# Patient Record
Sex: Male | Born: 1945 | Race: White | Hispanic: No | Marital: Married | Smoking: Former smoker
Health system: Southern US, Community
[De-identification: ages and names within clinical notes are randomized; demographics above are authoritative.]

## PROBLEM LIST (undated history)

## (undated) DIAGNOSIS — I803 Phlebitis and thrombophlebitis of lower extremities, unspecified: Secondary | ICD-10-CM

## (undated) DIAGNOSIS — G902 Horner's syndrome: Secondary | ICD-10-CM

## (undated) DIAGNOSIS — E785 Hyperlipidemia, unspecified: Secondary | ICD-10-CM

## (undated) DIAGNOSIS — N486 Induration penis plastica: Secondary | ICD-10-CM

## (undated) DIAGNOSIS — K4090 Unilateral inguinal hernia, without obstruction or gangrene, not specified as recurrent: Secondary | ICD-10-CM

## (undated) DIAGNOSIS — C801 Malignant (primary) neoplasm, unspecified: Secondary | ICD-10-CM

## (undated) DIAGNOSIS — G4700 Insomnia, unspecified: Secondary | ICD-10-CM

## (undated) DIAGNOSIS — I8390 Asymptomatic varicose veins of unspecified lower extremity: Secondary | ICD-10-CM

## (undated) HISTORY — PX: TOOTH EXTRACTION: SUR596

## (undated) HISTORY — DX: Unilateral inguinal hernia, without obstruction or gangrene, not specified as recurrent: K40.90

## (undated) HISTORY — DX: Induration penis plastica: N48.6

## (undated) HISTORY — DX: Hyperlipidemia, unspecified: E78.5

## (undated) HISTORY — DX: Horner's syndrome: G90.2

## (undated) HISTORY — PX: VASECTOMY: SHX75

## (undated) HISTORY — DX: Asymptomatic varicose veins of unspecified lower extremity: I83.90

## (undated) HISTORY — DX: Phlebitis and thrombophlebitis of lower extremities, unspecified: I80.3

## (undated) HISTORY — DX: Insomnia, unspecified: G47.00

## (undated) HISTORY — DX: Malignant (primary) neoplasm, unspecified: C80.1

## (undated) HISTORY — PX: EYE SURGERY: SHX253

---
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
## 1999-04-20 ENCOUNTER — Ambulatory Visit (HOSPITAL_COMMUNITY): Admission: RE | Admit: 1999-04-20 | Discharge: 1999-04-22 | Payer: Self-pay | Admitting: Ophthalmology

## 2000-08-22 ENCOUNTER — Ambulatory Visit (HOSPITAL_COMMUNITY): Admission: RE | Admit: 2000-08-22 | Discharge: 2000-08-22 | Payer: Self-pay | Admitting: Ophthalmology

## 2000-08-22 ENCOUNTER — Encounter: Payer: Self-pay | Admitting: Ophthalmology

## 2000-11-22 ENCOUNTER — Encounter: Payer: Self-pay | Admitting: Family Medicine

## 2000-11-22 ENCOUNTER — Ambulatory Visit (HOSPITAL_COMMUNITY): Admission: RE | Admit: 2000-11-22 | Discharge: 2000-11-22 | Payer: Self-pay | Admitting: Family Medicine

## 2002-05-25 ENCOUNTER — Encounter: Payer: Self-pay | Admitting: Family Medicine

## 2002-05-25 ENCOUNTER — Ambulatory Visit (HOSPITAL_COMMUNITY): Admission: RE | Admit: 2002-05-25 | Discharge: 2002-05-25 | Payer: Self-pay | Admitting: Family Medicine

## 2004-08-17 ENCOUNTER — Ambulatory Visit (HOSPITAL_COMMUNITY): Admission: RE | Admit: 2004-08-17 | Discharge: 2004-08-17 | Payer: Self-pay | Admitting: Ophthalmology

## 2004-08-17 IMAGING — CR DG CHEST 2V
2 series · 2 of 2 positions shown · non-contrast
Comparison: none

HISTORY: Preoperative evaluation

CHEST 2 VIEWS:
No prior studies available for comparison
Normal heart size, mediastinal contours, and vascularity.
Lungs clear.
No effusion or pneumothorax.
Minimal spur formation thoracic spine.

[view not recorded (1 of 2)]
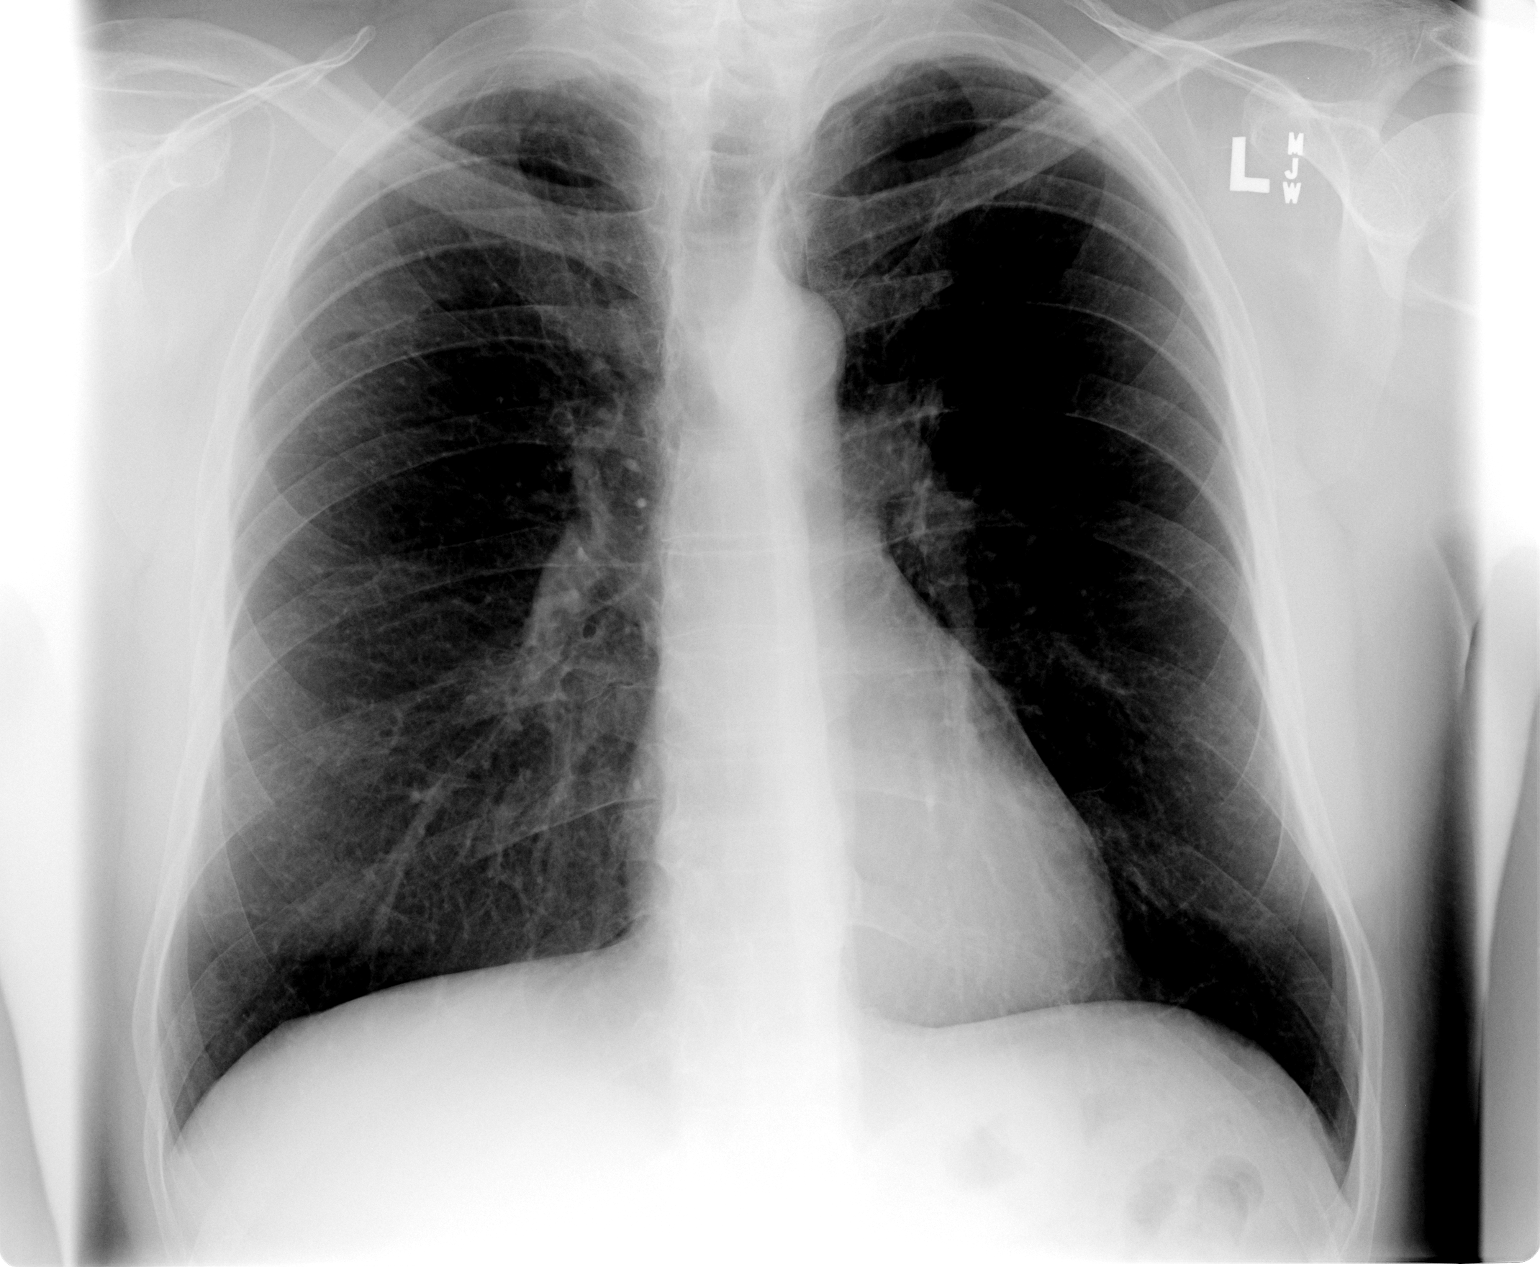

[view not recorded (2 of 2)]
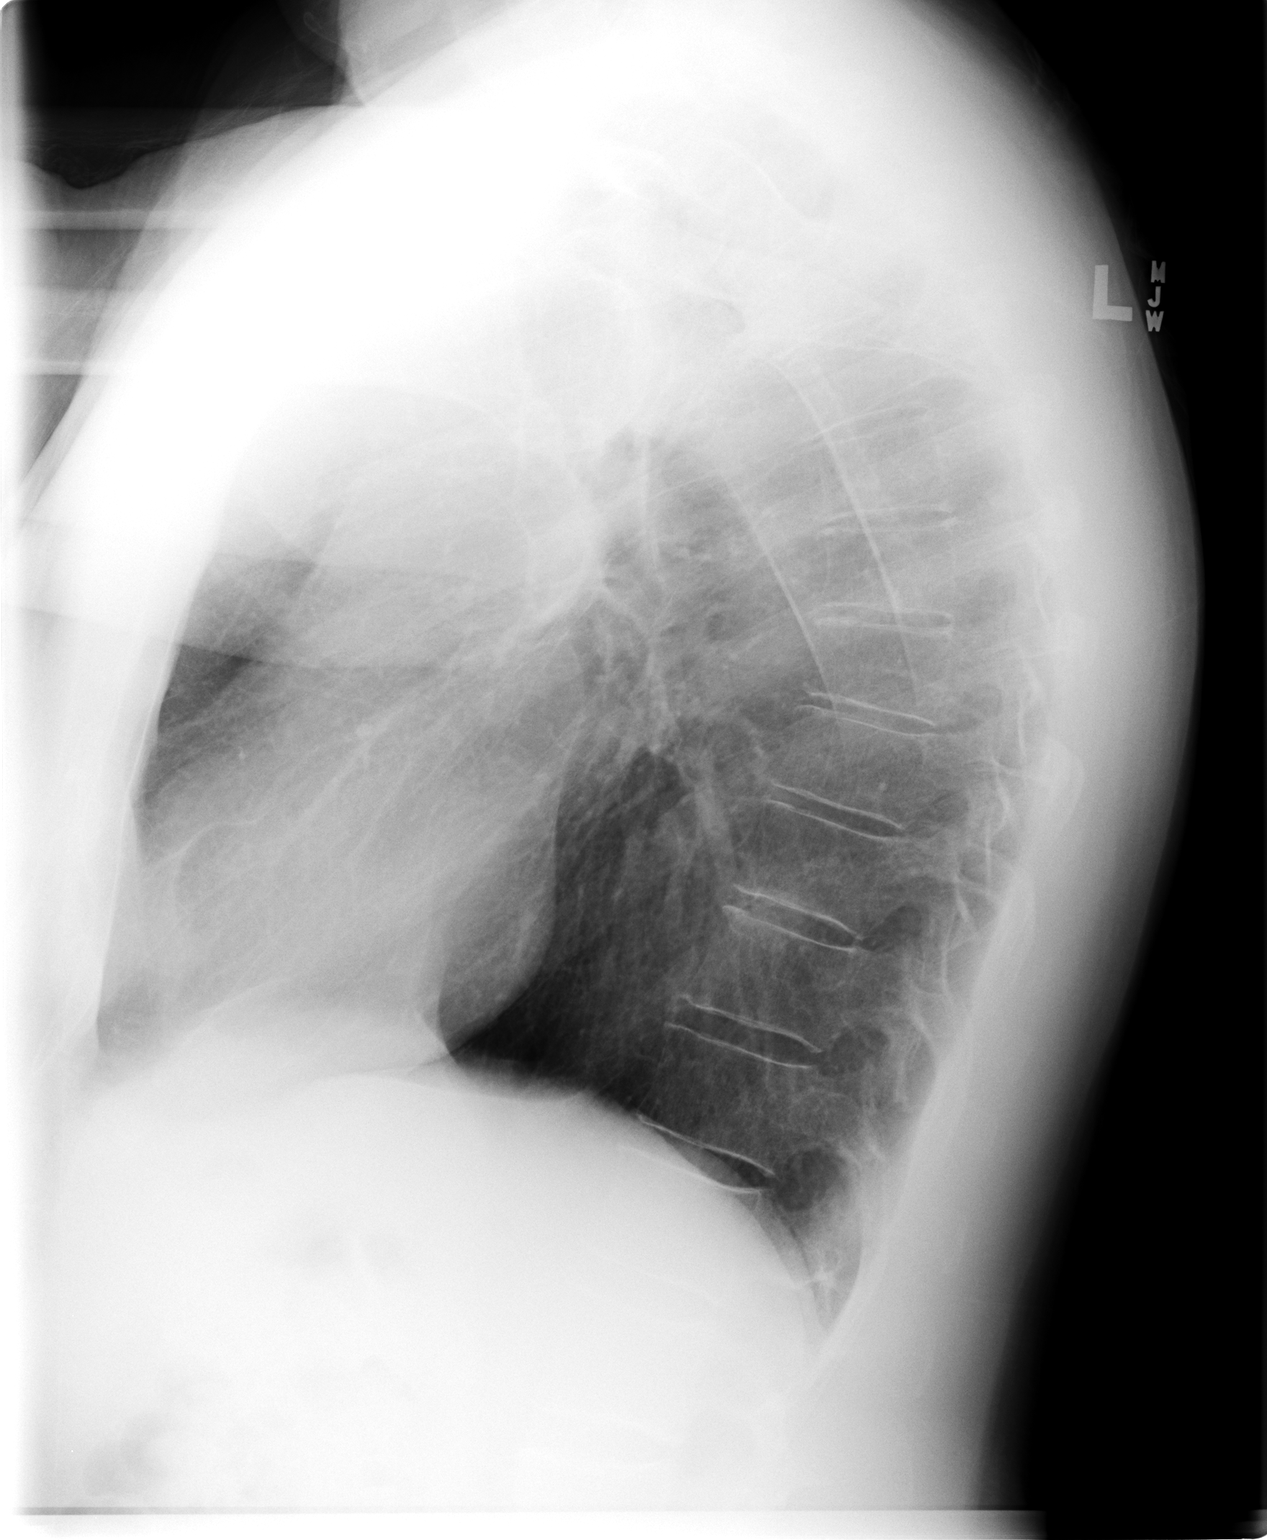

[2 of 2 positions shown; findings below may reference images not displayed]

IMPRESSION: No acute abnormalities.

## 2004-11-23 ENCOUNTER — Ambulatory Visit (HOSPITAL_COMMUNITY): Admission: RE | Admit: 2004-11-23 | Discharge: 2004-11-23 | Payer: Self-pay | Admitting: Gastroenterology

## 2005-04-02 ENCOUNTER — Ambulatory Visit: Admission: RE | Admit: 2005-04-02 | Discharge: 2005-04-02 | Payer: Self-pay | Admitting: Family Medicine

## 2006-03-06 HISTORY — PX: TOE SURGERY: SHX1073

## 2009-05-09 DIAGNOSIS — N486 Induration penis plastica: Secondary | ICD-10-CM

## 2009-05-09 HISTORY — DX: Induration penis plastica: N48.6

## 2010-05-25 DIAGNOSIS — E785 Hyperlipidemia, unspecified: Secondary | ICD-10-CM

## 2010-05-25 HISTORY — DX: Hyperlipidemia, unspecified: E78.5

## 2010-09-06 DIAGNOSIS — C801 Malignant (primary) neoplasm, unspecified: Secondary | ICD-10-CM

## 2010-09-06 DIAGNOSIS — I803 Phlebitis and thrombophlebitis of lower extremities, unspecified: Secondary | ICD-10-CM

## 2010-09-06 HISTORY — DX: Phlebitis and thrombophlebitis of lower extremities, unspecified: I80.3

## 2010-09-06 HISTORY — DX: Malignant (primary) neoplasm, unspecified: C80.1

## 2011-01-04 ENCOUNTER — Other Ambulatory Visit: Payer: Self-pay | Admitting: Family Medicine

## 2011-01-04 ENCOUNTER — Ambulatory Visit
Admission: RE | Admit: 2011-01-04 | Discharge: 2011-01-04 | Disposition: A | Discharge status: Home / Self Care | Payer: BC Managed Care – PPO | Source: Ambulatory Visit | Attending: Family Medicine | Admitting: Family Medicine

## 2011-01-04 DIAGNOSIS — R52 Pain, unspecified: Secondary | ICD-10-CM

## 2011-01-04 DIAGNOSIS — R609 Edema, unspecified: Secondary | ICD-10-CM

## 2011-02-16 ENCOUNTER — Other Ambulatory Visit: Payer: Self-pay | Admitting: Dermatology

## 2011-08-17 ENCOUNTER — Ambulatory Visit (INDEPENDENT_AMBULATORY_CARE_PROVIDER_SITE_OTHER): Payer: Self-pay | Admitting: General Surgery

## 2011-08-20 ENCOUNTER — Ambulatory Visit (INDEPENDENT_AMBULATORY_CARE_PROVIDER_SITE_OTHER): Payer: BC Managed Care – PPO | Admitting: General Surgery

## 2011-08-20 ENCOUNTER — Encounter (INDEPENDENT_AMBULATORY_CARE_PROVIDER_SITE_OTHER): Payer: Self-pay | Admitting: General Surgery

## 2011-08-20 VITALS — BP 124/82 | HR 70 | Temp 97.6°F | Resp 16 | Ht 72.0 in | Wt 194.8 lb

## 2011-08-20 DIAGNOSIS — K4090 Unilateral inguinal hernia, without obstruction or gangrene, not specified as recurrent: Secondary | ICD-10-CM

## 2011-08-20 NOTE — Progress Notes (Signed)
Patient ID: Vernon Kent, male   DOB: Apr 03, 1946, 65 y.o.   MRN: [id number]  Chief Complaint  Patient presents with  . Other    Evaluate RIH    HPI Vernon Kent is a 65 y.o. male.  This patient is referred by Dr. Kevan Ny for evaluation of a right inguinal hernia. He states that he was diagnosed with a weakness in the right groin approximately 10 years ago by his primary care physician and saw a surgeon here for evaluation but they decided not to perform surgery at that time. He has really been bothered by this until recently he's had some achiness in the area and hears an audible "squishy noise" in the area. He does feel something bulging in the area although he does not notice a visible bulge. He states that his bowels are normal and he denies any nausea vomiting or obstructive symptoms. HPI  Past Medical History  Diagnosis Date  . Hyperlipidemia   . Phlebitis of leg, left 2012    Past Surgical History  Procedure Date  . Eye surgery     No family history on file.  Social History History  Substance Use Topics  . Smoking status: Former Games developer  . Smokeless tobacco: Former Neurosurgeon    Quit date: 08/19/1966  . Alcohol Use: 0.5 - 1.0 oz/week    1-2 drink(s) per week    Allergies no known allergies  Current Outpatient Prescriptions  Medication Sig Dispense Refill  . clobetasol cream (TEMOVATE) 0.05 %       . olopatadine (PATANOL) 0.1 % ophthalmic solution 1 drop 2 (two) times daily.        . simvastatin (ZOCOR) 20 MG tablet         Review of Systems Review of Systems All other review of systems negative or noncontributory except as stated in the HPI  Blood pressure 124/82, pulse 70, temperature 97.6 F (36.4 C), temperature source Temporal, resp. rate 16, height 6' (1.829 m), weight 194 lb 12.8 oz (88.361 kg).  Physical Exam Physical Exam  Vitals reviewed. Constitutional: He is oriented to person, place, and time. He appears well-developed and well-nourished. No  distress.  HENT:  Head: Normocephalic and atraumatic.  Mouth/Throat: No oropharyngeal exudate.  Eyes: Conjunctivae are normal. Pupils are equal, round, and reactive to light. Right eye exhibits no discharge. Left eye exhibits no discharge. No scleral icterus.       Spot on right Iris  Neck: Normal range of motion. No tracheal deviation present.  Cardiovascular: Normal rate, regular rhythm and normal heart sounds.   Pulmonary/Chest: Effort normal and breath sounds normal. No stridor. No respiratory distress. He has no wheezes.  Abdominal: Soft. He exhibits no distension. There is no tenderness. There is no rebound and no guarding.       He does appear to have a reducible inguinal hernia on the right and has a visible bulge with standing.  no evidence of left inguinal hernia. I also feel a small reducible umbilical hernia.  Musculoskeletal: Normal range of motion. He exhibits no edema and no tenderness.  Neurological: He is alert and oriented to person, place, and time.  Skin: Skin is warm and dry. No rash noted. He is not diaphoretic. No erythema. No pallor.  Psychiatric: He has a normal mood and affect. His behavior is normal. Judgment and thought content normal.    Data Reviewed   Assessment    Right inguinal hernia and umbilical hernia He has no evidence of incarceration  or strangulation and is relatively asymptomatic although he does have some mild symptoms. We discussed the options of watchful waiting versus open or laparoscopic repair and the risks and benefits of each. He is leaning towards surgical repair but will discuss this with his wife prior to scheduling. I think he would be a good candidate for laparoscopic repair given the fact he has an umbilical hernia as well and we could fix this simultaneously. We discussed the risks of infection bleeding pain, scarring, recurrence, injury to vas deferens and loss of testicle, and chronic pain and a specimen understanding and desires  surgical repair.    Plan    He will discuss this with his wife and call us back to schedule surgical repair.       Lodema Pilot DAVID 08/20/2011, 11:55 AM

## 2012-03-07 ENCOUNTER — Other Ambulatory Visit: Payer: Self-pay

## 2012-03-07 DIAGNOSIS — I83893 Varicose veins of bilateral lower extremities with other complications: Secondary | ICD-10-CM

## 2012-03-31 ENCOUNTER — Encounter: Payer: Self-pay | Admitting: Vascular Surgery

## 2012-04-11 ENCOUNTER — Encounter: Payer: Self-pay | Admitting: Vascular Surgery

## 2012-04-11 ENCOUNTER — Encounter (INDEPENDENT_AMBULATORY_CARE_PROVIDER_SITE_OTHER): Payer: Self-pay | Admitting: Surgery

## 2012-04-11 ENCOUNTER — Ambulatory Visit (INDEPENDENT_AMBULATORY_CARE_PROVIDER_SITE_OTHER): Payer: BC Managed Care – PPO | Admitting: Surgery

## 2012-04-11 VITALS — BP 118/72 | HR 74 | Temp 97.8°F | Resp 16 | Ht 72.0 in | Wt 186.1 lb

## 2012-04-11 DIAGNOSIS — K4090 Unilateral inguinal hernia, without obstruction or gangrene, not specified as recurrent: Secondary | ICD-10-CM | POA: Insufficient documentation

## 2012-04-11 NOTE — Progress Notes (Signed)
Patient ID: Vernon Kent, male   DOB: 01-14-1946, 66 y.o.   MRN: [id number]  CC - right inguinal hernia  HPI Vernon Kent is a 66 y.o. male.   HPI This is a 66 yo patient in good health who presents with at least a 14-year history of a right inguinal hernia.  Recently, it has become more symptomatic and the bulge seems more prominent.  The patient occasionally hears some "gurgling" in the hernia.  He is quite active and exercises regularly, but the hernia is causing more discomfort.  It remains reducible.  He presents now to discuss surgical repair. Past Medical History  Diagnosis Date  . Phlebitis of leg, left 2012  . Asymptomatic varicose veins   . Varicose veins   . Insomnia   . 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 Dione Booze  )  . Diverticulosis   . Cancer 2012    BCC- Basal Cell Carcinoma Forehead  . Peyronie disease 05/09/2009  . Hyperlipidemia 05/25/2010    Past Surgical History  Procedure Date  . Eye surgery   . Hernia repair 05/25/2010    Right inguinal   . Toe surgery July 2007    Left 2nd toe  . Tonsillectomy 1955  . Vasectomy   . Tooth extraction     Wisdom teeth    Family History  Problem Relation Age of Onset  . Pneumonia Mother   . Skin cancer Mother   . Heart disease Father   . Hypertension Father   . Hepatitis Father   . Cancer Brother     Prostate  . Heart attack Brother     Social History History  Substance Use Topics  . Smoking status: Former Smoker    Types: Cigarettes    Quit date: 09/06/1965  . Smokeless tobacco: Former Neurosurgeon    Quit date: 08/19/1966  . Alcohol Use: 0.0 oz/week    4-5 Glasses of wine, 1-2 Drinks containing 0.5 oz of alcohol per week    Allergies  Allergen Reactions  . Shellfish Allergy Anaphylaxis  . Iodine     Current Outpatient Prescriptions  Medication Sig Dispense Refill  . aspirin 81 MG tablet Take 81 mg by mouth daily.      . betamethasone dipropionate (DIPROLENE) 0.05 %  cream Apply topically daily.      . calcium carbonate 200 MG capsule Take 250 mg by mouth 2 (two) times daily with a meal.      . cholecalciferol (VITAMIN D) 400 UNITS TABS Take 400 Units by mouth.      . clobetasol cream (TEMOVATE) 0.05 %       . diphenhydrAMINE (BENADRYL) 25 mg capsule Take 25 mg by mouth as needed.      Marland Kitchen EPINEPHrine (EPI-PEN) 0.3 mg/0.3 mL DEVI Inject 0.3 mg into the muscle once.      . fish oil-omega-3 fatty acids 1000 MG capsule Take 2 g by mouth daily.      Marland Kitchen glucosamine-chondroitin 500-400 MG tablet Take 1,500 tablets by mouth daily.      Marland Kitchen ibuprofen (ADVIL,MOTRIN) 200 MG tablet Take 200 mg by mouth every 6 (six) hours as needed.      . Multiple Vitamin (MULTIVITAMIN) tablet Take 1 tablet by mouth daily.      Marland Kitchen olopatadine (PATANOL) 0.1 % ophthalmic solution 1 drop 2 (two) times daily.        . simvastatin (ZOCOR) 20 MG tablet       .  zolpidem (AMBIEN) 10 MG tablet Take 10 mg by mouth at bedtime as needed.        Review of Systems Review of Systems  Constitutional: Negative for fever, chills and unexpected weight change.  HENT: Negative for hearing loss, congestion, sore throat, trouble swallowing and voice change.   Eyes: Negative for visual disturbance.  Respiratory: Negative for cough and wheezing.   Cardiovascular: Negative for chest pain, palpitations and leg swelling.  Gastrointestinal: Positive for abdominal pain. Negative for nausea, vomiting, diarrhea, constipation, blood in stool, abdominal distention, anal bleeding and rectal pain.  Genitourinary: Negative for hematuria and difficulty urinating.  Musculoskeletal: Negative for arthralgias.  Skin: Negative for rash and wound.  Neurological: Negative for seizures, syncope, weakness and headaches.  Hematological: Negative for adenopathy. Does not bruise/bleed easily.  Psychiatric/Behavioral: Negative for confusion.    Blood pressure 118/72, pulse 74, temperature 97.8 F (36.6 C), temperature source  Temporal, resp. rate 16, height 6' (1.829 m), weight 186 lb 2 oz (84.426 kg).  Physical Exam Physical Exam WDWN in NAD HEENT:  EOMI, sclera anicteric Neck:  No masses, no thyromegaly Lungs:  CTA bilaterally; normal respiratory effort CV:  Regular rate and rhythm; no murmurs Abd:  +bowel sounds, soft, non-tender, very tiny asymptomatic umbilical hernia GU:  Bilateral descended testes; visible right inguinal bulge - reducible; no sign of left inguinal hernia Ext:  Well-perfused; no edema Skin:  Warm, dry; no sign of jaundice  Data Reviewed none  Assessment    Right inguinal hernia - large reducible    Plan    Right inguinal hernia repair with mesh.  The surgical procedure has been discussed with the patient.  Potential risks, benefits, alternative treatments, and expected outcomes have been explained.  All of the patient's questions at this time have been answered.  The likelihood of reaching the patient's treatment goal is good.  The patient understand the proposed surgical procedure and wishes to proceed.        Soren Lazarz K. 04/11/2012, 7:53 PM

## 2012-04-12 ENCOUNTER — Encounter (INDEPENDENT_AMBULATORY_CARE_PROVIDER_SITE_OTHER): Payer: BC Managed Care – PPO | Admitting: *Deleted

## 2012-04-12 ENCOUNTER — Ambulatory Visit (INDEPENDENT_AMBULATORY_CARE_PROVIDER_SITE_OTHER): Payer: BC Managed Care – PPO | Admitting: Vascular Surgery

## 2012-04-12 ENCOUNTER — Encounter: Payer: Self-pay | Admitting: Vascular Surgery

## 2012-04-12 VITALS — BP 120/74 | HR 54 | Resp 18 | Ht 72.0 in | Wt 187.1 lb

## 2012-04-12 DIAGNOSIS — I83893 Varicose veins of bilateral lower extremities with other complications: Secondary | ICD-10-CM

## 2012-04-12 NOTE — Assessment & Plan Note (Signed)
This patient has significant varicose veins of the left lower extremity with incompetence of the greater saphenous vein on the left and also the lesser saphenous vein. I have discussed the importance of intermittent leg elevation and the proper positioning for this. We have also discussed the importance of using compression stockings. He has been using a knee-high stockings however it sounds like these are stretched out. I have written him a prescription for a thigh high compression stocking with a gradient of 20-30 mm of mercury. We discussed the importance of trying to avoid prolonged sitting and standing. I'll arrange for him to be seen back in 3 months by Dr. Hart Rochester or Dr. Arbie Cookey. If his symptoms are not improved he might be considered for laser ablation of the left greater saphenous vein and left lesser saphenous vein. He knows to call sooner if he has problems.

## 2012-04-12 NOTE — Progress Notes (Signed)
[facility]  Patient name: Vernon Kent MRN: [id number] DOB: [birth year] Sex: male  REASON FOR CONSULT: painful varicose veins of the left lower extremity. Referred by Dr. Shaune Pollack  HPI: Vernon Kent is a 66 y.o. male who is had a long history of varicose veins of his left lower extremity. He was on a trip approximately 18 months ago [place] and developed significant symptoms in his left calf. He had a venous duplex which did not show any evidence of DVT but it sounds like he had an episode of phlebitis. Since that time he continued to have aching pain in his left leg which is worsened by prolonged standing. His symptoms are relieved somewhat with elevation. He describes as heaviness in his leg with prolonged standing. He's had no bleeding episodes related to his varicose veins.  Past Medical History  Diagnosis Date  . Phlebitis of leg, left 2012  . Asymptomatic varicose veins   . Varicose veins   . Insomnia   . 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 Dione Booze  )  . Diverticulosis   . Peyronie disease 05/09/2009  . Hyperlipidemia 05/25/2010  . Cancer 2012    BCC- Basal Cell Carcinoma Forehead    Family History  Problem Relation Age of Onset  . Pneumonia Mother   . Skin cancer Mother   . Heart disease Father   . Hypertension Father   . Hepatitis Father   . Cancer Brother     Prostate  . Heart attack Brother     SOCIAL HISTORY: History  Substance Use Topics  . Smoking status: Former Smoker    Types: Cigarettes    Quit date: 09/06/1965  . Smokeless tobacco: Former Neurosurgeon    Quit date: 08/19/1966  . Alcohol Use: 0.0 oz/week    4-5 Glasses of wine, 1-2 Drinks containing 0.5 oz of alcohol per week    Allergies  Allergen Reactions  . Shellfish Allergy Anaphylaxis  . Iodine     Current Outpatient Prescriptions  Medication Sig Dispense Refill  . aspirin 81 MG tablet Take 81 mg by mouth daily.      .  betamethasone dipropionate (DIPROLENE) 0.05 % cream Apply topically as needed.       . calcium carbonate 200 MG capsule Take 250 mg by mouth 2 (two) times daily with a meal.      . cholecalciferol (VITAMIN D) 400 UNITS TABS Take 400 Units by mouth.      . clobetasol cream (TEMOVATE) 0.05 % as needed.       . diphenhydrAMINE (BENADRYL) 25 mg capsule Take 25 mg by mouth as needed.      Marland Kitchen EPINEPHrine (EPI-PEN) 0.3 mg/0.3 mL DEVI Inject 0.3 mg into the muscle once.      . fish oil-omega-3 fatty acids 1000 MG capsule Take 2 g by mouth daily.      Marland Kitchen glucosamine-chondroitin 500-400 MG tablet Take 1,500 tablets by mouth daily.      Marland Kitchen ibuprofen (ADVIL,MOTRIN) 200 MG tablet Take 200 mg by mouth every 6 (six) hours as needed.      . Multiple Vitamin (MULTIVITAMIN) tablet Take 1 tablet by mouth daily.      Marland Kitchen olopatadine (PATANOL) 0.1 % ophthalmic solution Place 1 drop into both eyes as needed.       . simvastatin (ZOCOR) 20 MG tablet daily.       Marland Kitchen zolpidem (AMBIEN) 10 MG tablet  Take 10 mg by mouth at bedtime as needed.        REVIEW OF SYSTEMS: Arly.Keller ] denotes positive finding; [  ] denotes negative finding  CARDIOVASCULAR:  [ ]  chest pain   [ ]  chest pressure   [ ]  palpitations   [ ]  orthopnea   [ ]  dyspnea on exertion   [ ]  claudication   [ ]  rest pain   [ ]  DVT   Arly.Keller ] phlebitis-left calf PULMONARY:   [ ]  productive cough   [ ]  asthma   [ ]  wheezing NEUROLOGIC:   [ ]  weakness  [ ]  paresthesias  [ ]  aphasia  [ ]  amaurosis  [ ]  dizziness HEMATOLOGIC:   [ ]  bleeding problems   [ ]  clotting disorders MUSCULOSKELETAL:  [ ]  joint pain   [ ]  joint swelling Arly.Keller ] leg swelling left leg GASTROINTESTINAL: [ ]   blood in stool  [ ]   hematemesis GENITOURINARY:  [ ]   dysuria  [ ]   hematuria PSYCHIATRIC:  [ ]  history of major depression INTEGUMENTARY:  [ ]  rashes  [ ]  ulcers CONSTITUTIONAL:  [ ]  fever   [ ]  chills  PHYSICAL EXAM: Filed Vitals:   04/12/12 1352  BP: 120/74  Pulse: 54  Resp: 18  Height: 6' (1.829  m)  Weight: 187 lb 1.6 oz (84.868 kg)   Body mass index is 25.38 kg/(m^2). GENERAL: The patient is a well-nourished male, in no acute distress. The vital signs are documented above. CARDIOVASCULAR: There is a regular rate and rhythm without significant murmur appreciated. I do not detect carotid bruits. He has palpable femoral, popliteal, and pedal pulses. He has mild left lower extremity swelling. PULMONARY: There is good air exchange bilaterally without wheezing or rales. ABDOMEN: Soft and non-tender with normal pitched bowel sounds.  MUSCULOSKELETAL: There are no major deformities or cyanosis. NEUROLOGIC: No focal weakness or paresthesias are detected. SKIN: he has some large truncal varicosities along the medial aspect of his left calf. PSYCHIATRIC: The patient has a normal affect.  DATA:  I have independently interpreted his venous duplex scan which shows incompetence of the lesser saphenous vein on the left and also the midportion of the greater saphenous vein in the left leg. There is also some incompetence of the deep veins. There is no evidence of DVT.  MEDICAL ISSUES:  Varicose veins of lower extremities with other complications This patient has significant varicose veins of the left lower extremity with incompetence of the greater saphenous vein on the left and also the lesser saphenous vein. I have discussed the importance of intermittent leg elevation and the proper positioning for this. We have also discussed the importance of using compression stockings. He has been using a knee-high stockings however it sounds like these are stretched out. I have written him a prescription for a thigh high compression stocking with a gradient of 20-30 mm of mercury. We discussed the importance of trying to avoid prolonged sitting and standing. I'll arrange for him to be seen back in 3 months by Dr. Hart Rochester or Dr. Arbie Cookey. If his symptoms are not improved he might be considered for laser ablation of  the left greater saphenous vein and left lesser saphenous vein. He knows to call sooner if he has problems.   Alexzia Kasler S [facility] [place] Beeper: [phone]

## 2012-07-13 ENCOUNTER — Other Ambulatory Visit (INDEPENDENT_AMBULATORY_CARE_PROVIDER_SITE_OTHER): Payer: Self-pay | Admitting: Surgery

## 2012-07-13 NOTE — Progress Notes (Signed)
Patient is scheduled for surgery on 07/28/12  Preop on 07/25/12.  Need orders in EPIC.  Thank You.

## 2012-07-24 ENCOUNTER — Encounter (HOSPITAL_COMMUNITY): Payer: Self-pay | Admitting: Pharmacist

## 2012-07-24 ENCOUNTER — Encounter: Payer: Self-pay | Admitting: Vascular Surgery

## 2012-07-24 NOTE — Patient Instructions (Addendum)
20 REUVEN BRAVER  07/24/2012   Your procedure is scheduled on:  07-28-2012  Report to [facility] at 0530  AM.  Call this number if you have problems the morning of surgery: [phone]   Remember:  Driver for day of surgery and someone to stay with you for 24 hours after surgery. Wife lynne [phone] cell   Do not eat food or drink liquids:After Midnight.  .  Take these medicines the morning of surgery with A SIP OF WATER: pataday eye drop if needed.   Do not wear jewelry or make up.  Do not wear lotions, powders, or perfumes. You may wear deodorant.    Do not bring valuables to the hospital.  Contacts, dentures or bridgework may not be worn into surgery.  Leave suitcase in the car. After surgery it may be brought to your room.  For patients admitted to the hospital, checkout time is 11:00 AM the day of discharge                             Patients discharged the day of surgery will not be allowed to drive home. If going home same day of surgery, you must have someone stay with you the first 24 hours at home and arrange for some one to drive you home from hospital.    Special Instructions: See [facility] Preparing for Surgery instruction sheet. Women do not shave legs or underarms for 12 hours before showers. Men may shave face morning of surgery.    Please read over the following fact sheets that you were given: MRSA Information  Cain S[facility] pre op nurse phone number [phone], call if needed

## 2012-07-25 ENCOUNTER — Encounter (HOSPITAL_COMMUNITY)
Admission: RE | Admit: 2012-07-25 | Discharge: 2012-07-25 | Disposition: A | Discharge status: Home / Self Care | Payer: BC Managed Care – PPO | Source: Ambulatory Visit | Attending: Surgery | Admitting: Surgery

## 2012-07-25 ENCOUNTER — Encounter: Payer: Self-pay | Admitting: Vascular Surgery

## 2012-07-25 ENCOUNTER — Ambulatory Visit (INDEPENDENT_AMBULATORY_CARE_PROVIDER_SITE_OTHER): Payer: BC Managed Care – PPO | Admitting: Vascular Surgery

## 2012-07-25 ENCOUNTER — Other Ambulatory Visit (INDEPENDENT_AMBULATORY_CARE_PROVIDER_SITE_OTHER): Payer: Self-pay | Admitting: Surgery

## 2012-07-25 ENCOUNTER — Encounter (HOSPITAL_COMMUNITY): Payer: Self-pay

## 2012-07-25 VITALS — BP 140/114 | HR 107 | Resp 16 | Ht 72.0 in | Wt 191.5 lb

## 2012-07-25 DIAGNOSIS — I8390 Asymptomatic varicose veins of unspecified lower extremity: Secondary | ICD-10-CM | POA: Insufficient documentation

## 2012-07-25 LAB — CBC
Platelets: 277 10*3/uL (ref 150–400)
RBC: 4.84 MIL/uL (ref 4.22–5.81)
WBC: 7.2 10*3/uL (ref 4.0–10.5)

## 2012-07-25 LAB — BASIC METABOLIC PANEL
CO2: 26 mEq/L (ref 19–32)
Glucose, Bld: 91 mg/dL (ref 70–99)
Potassium: 3.7 mEq/L (ref 3.5–5.1)
Sodium: 139 mEq/L (ref 135–145)

## 2012-07-25 LAB — DIFFERENTIAL
Lymphocytes Relative: 24 % (ref 12–46)
Lymphs Abs: 1.7 10*3/uL (ref 0.7–4.0)
Monocytes Absolute: 0.7 10*3/uL (ref 0.1–1.0)
Monocytes Relative: 9 % (ref 3–12)
Neutro Abs: 4.6 10*3/uL (ref 1.7–7.7)

## 2012-07-25 MED ORDER — VANCOMYCIN HCL 1000 MG IV SOLR: 1000.0000 mg | Freq: Once | INTRAVENOUS | Status: DC

## 2012-07-25 NOTE — Progress Notes (Signed)
Problems with Activities of Daily Living Secondary to Leg Pain  1. 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  2. Vernon Kent has stopped running for exercise due to leg pain.  3. 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  All these activities are very difficult due to leg pain.     Failure of  Conservative Therapy:  1. Worn 20-30 mm Hg thigh high compression hose >3 months with no relief of symptoms.  2. Frequently elevates legs-no relief of symptoms  3. Taken Ibuprofen 600 Mg TID with no relief of symptoms.  The patient continues to have significant discomfort despite graduated compression garments. This is over the large varicosities his left posterior calf.  I did reimage his veins with SonoSite. This does show marked reflux and a small saphenous vein extending into these large varicosities. He does have less significant reflux in his left great saphenous vein and there is some distal connection to the varicosities with this does not appear to be the same level of contribution.  Foot is clearly failed conservative treatment. I have recommended laser ablation of his small saphenous vein and stab phlebectomy of his large tributary varicosities. I feel this is a very high chance of complete taking care of this problem. I did explain that he does have persistent difficulty may require treatment of his great saphenous vein in the future. He understands and wished to proceed as soon as possible. I did explain this is a outpatient procedure under local anesthetic. I did explain very slight risk of deep venous injury with the procedure.

## 2012-07-27 NOTE — Anesthesia Preprocedure Evaluation (Addendum)

## 2012-07-28 ENCOUNTER — Encounter (HOSPITAL_COMMUNITY)
Admission: RE | Disposition: A | Discharge status: Home / Self Care | Payer: Self-pay | Source: Ambulatory Visit | Attending: Surgery

## 2012-07-28 ENCOUNTER — Encounter (HOSPITAL_COMMUNITY): Payer: Self-pay | Admitting: *Deleted

## 2012-07-28 ENCOUNTER — Ambulatory Visit (HOSPITAL_COMMUNITY): Payer: BC Managed Care – PPO | Admitting: Anesthesiology

## 2012-07-28 ENCOUNTER — Ambulatory Visit (HOSPITAL_COMMUNITY)
Admission: RE | Admit: 2012-07-28 | Discharge: 2012-07-28 | Disposition: A | Discharge status: Home / Self Care | Payer: BC Managed Care – PPO | Source: Ambulatory Visit | Attending: Surgery | Admitting: Surgery

## 2012-07-28 ENCOUNTER — Encounter (HOSPITAL_COMMUNITY): Payer: Self-pay | Admitting: Anesthesiology

## 2012-07-28 DIAGNOSIS — Z01812 Encounter for preprocedural laboratory examination: Secondary | ICD-10-CM | POA: Insufficient documentation

## 2012-07-28 DIAGNOSIS — E785 Hyperlipidemia, unspecified: Secondary | ICD-10-CM | POA: Insufficient documentation

## 2012-07-28 DIAGNOSIS — Z79899 Other long term (current) drug therapy: Secondary | ICD-10-CM | POA: Insufficient documentation

## 2012-07-28 DIAGNOSIS — K4090 Unilateral inguinal hernia, without obstruction or gangrene, not specified as recurrent: Secondary | ICD-10-CM

## 2012-07-28 DIAGNOSIS — Z7982 Long term (current) use of aspirin: Secondary | ICD-10-CM | POA: Insufficient documentation

## 2012-07-28 HISTORY — PX: INGUINAL HERNIA REPAIR: SHX194

## 2012-07-28 HISTORY — PX: INSERTION OF MESH: SHX5868

## 2012-07-28 SURGERY — REPAIR, HERNIA, INGUINAL, ADULT
Anesthesia: General | Site: Abdomen | Laterality: Right | Wound class: Clean

## 2012-07-28 MED ORDER — FENTANYL CITRATE 0.05 MG/ML IJ SOLN: 25.0000 ug | INTRAMUSCULAR | Status: DC | PRN

## 2012-07-28 MED ORDER — CEFAZOLIN SODIUM-DEXTROSE 2-3 GM-% IV SOLR
2.0000 g | INTRAVENOUS | Status: AC
  Administered 2012-07-28: 2 g via INTRAVENOUS

## 2012-07-28 MED ORDER — ACETAMINOPHEN 10 MG/ML IV SOLN
INTRAVENOUS | Status: DC | PRN
  Administered 2012-07-28: 1000 mg via INTRAVENOUS

## 2012-07-28 MED ORDER — LACTATED RINGERS IV SOLN: INTRAVENOUS | Status: DC

## 2012-07-28 MED ORDER — OXYCODONE-ACETAMINOPHEN 5-325 MG PO TABS: 1.0000 | ORAL_TABLET | ORAL | Status: AC | PRN

## 2012-07-28 MED ORDER — OXYCODONE-ACETAMINOPHEN 5-325 MG PO TABS: 1.0000 | ORAL_TABLET | ORAL | Status: DC | PRN

## 2012-07-28 MED ORDER — MORPHINE SULFATE 10 MG/ML IJ SOLN: 2.0000 mg | INTRAMUSCULAR | Status: DC | PRN

## 2012-07-28 MED ORDER — CEFAZOLIN SODIUM-DEXTROSE 2-3 GM-% IV SOLR
INTRAVENOUS | Status: AC
  Filled 2012-07-28: qty 50

## 2012-07-28 MED ORDER — FENTANYL CITRATE 0.05 MG/ML IJ SOLN
INTRAMUSCULAR | Status: DC | PRN
  Administered 2012-07-28 (×2): 50 ug via INTRAVENOUS

## 2012-07-28 MED ORDER — LIDOCAINE HCL (CARDIAC) 20 MG/ML IV SOLN
INTRAVENOUS | Status: DC | PRN
  Administered 2012-07-28: 50 mg via INTRAVENOUS

## 2012-07-28 MED ORDER — BUPIVACAINE-EPINEPHRINE PF 0.25-1:200000 % IJ SOLN
INTRAMUSCULAR | Status: DC | PRN
  Administered 2012-07-28: 20 mL

## 2012-07-28 MED ORDER — ACETAMINOPHEN 10 MG/ML IV SOLN
INTRAVENOUS | Status: AC
  Filled 2012-07-28: qty 100

## 2012-07-28 MED ORDER — ONDANSETRON HCL 4 MG/2ML IJ SOLN: 4.0000 mg | INTRAMUSCULAR | Status: DC | PRN

## 2012-07-28 MED ORDER — 0.9 % SODIUM CHLORIDE (POUR BTL) OPTIME
TOPICAL | Status: DC | PRN
  Administered 2012-07-28: 1000 mL

## 2012-07-28 MED ORDER — PROPOFOL 10 MG/ML IV BOLUS
INTRAVENOUS | Status: DC | PRN
  Administered 2012-07-28: 170 mg via INTRAVENOUS

## 2012-07-28 MED ORDER — DEXAMETHASONE SODIUM PHOSPHATE 10 MG/ML IJ SOLN
INTRAMUSCULAR | Status: DC | PRN
  Administered 2012-07-28: 10 mg via INTRAVENOUS

## 2012-07-28 MED ORDER — LACTATED RINGERS IV SOLN
INTRAVENOUS | Status: DC | PRN
  Administered 2012-07-28 (×2): via INTRAVENOUS

## 2012-07-28 MED ORDER — BUPIVACAINE-EPINEPHRINE PF 0.25-1:200000 % IJ SOLN
INTRAMUSCULAR | Status: AC
  Filled 2012-07-28: qty 30

## 2012-07-28 MED ORDER — ONDANSETRON HCL 4 MG/2ML IJ SOLN
INTRAMUSCULAR | Status: DC | PRN
  Administered 2012-07-28: 4 mg via INTRAVENOUS

## 2012-07-28 MED ORDER — MIDAZOLAM HCL 5 MG/5ML IJ SOLN
INTRAMUSCULAR | Status: DC | PRN
  Administered 2012-07-28: 2 mg via INTRAVENOUS

## 2012-07-28 SURGICAL SUPPLY — 48 items
APL SKNCLS STERI-STRIP NONHPOA (GAUZE/BANDAGES/DRESSINGS) ×1
BENZOIN TINCTURE PRP APPL 2/3 (GAUZE/BANDAGES/DRESSINGS) ×2 IMPLANT
BLADE HEX COATED 2.75 (ELECTRODE) ×2 IMPLANT
CHLORAPREP W/TINT 26ML (MISCELLANEOUS) ×2 IMPLANT
CLOTH BEACON ORANGE TIMEOUT ST (SAFETY) ×2 IMPLANT
COVER SURGICAL LIGHT HANDLE (MISCELLANEOUS) ×2 IMPLANT
DECANTER SPIKE VIAL GLASS SM (MISCELLANEOUS) ×2 IMPLANT
DISSECTOR ROUND CHERRY 3/8 STR (MISCELLANEOUS) ×2 IMPLANT
DRAIN PENROSE 18X1/2 LTX STRL (DRAIN) IMPLANT
DRAPE LAPAROTOMY TRNSV 102X78 (DRAPE) ×2 IMPLANT
DRAPE UTILITY XL STRL (DRAPES) ×2 IMPLANT
DRSG TEGADERM 4X4.75 (GAUZE/BANDAGES/DRESSINGS) ×1 IMPLANT
ELECT REM PT RETURN 9FT ADLT (ELECTROSURGICAL) ×2
ELECTRODE REM PT RTRN 9FT ADLT (ELECTROSURGICAL) ×1 IMPLANT
GAUZE SPONGE 4X4 16PLY XRAY LF (GAUZE/BANDAGES/DRESSINGS) IMPLANT
GLOVE BIO SURGEON STRL SZ7 (GLOVE) ×3 IMPLANT
GLOVE BIOGEL PI IND STRL 6.5 (GLOVE) IMPLANT
GLOVE BIOGEL PI IND STRL 7.0 (GLOVE) ×1 IMPLANT
GLOVE BIOGEL PI IND STRL 7.5 (GLOVE) ×1 IMPLANT
GLOVE BIOGEL PI INDICATOR 6.5 (GLOVE) ×1
GLOVE BIOGEL PI INDICATOR 7.0 (GLOVE) ×1
GLOVE BIOGEL PI INDICATOR 7.5 (GLOVE) ×1
GLOVE SURG SS PI 6.5 STRL IVOR (GLOVE) ×1 IMPLANT
GOWN STRL NON-REIN LRG LVL3 (GOWN DISPOSABLE) ×4 IMPLANT
GOWN STRL REIN XL XLG (GOWN DISPOSABLE) ×2 IMPLANT
KIT BASIN OR (CUSTOM PROCEDURE TRAY) ×2 IMPLANT
MESH ULTRAPRO 3X6 7.6X15CM (Mesh General) ×1 IMPLANT
NDL HYPO 25X1 1.5 SAFETY (NEEDLE) ×1 IMPLANT
NEEDLE HYPO 25X1 1.5 SAFETY (NEEDLE) ×2 IMPLANT
NS IRRIG 1000ML POUR BTL (IV SOLUTION) ×2 IMPLANT
PACK GENERAL/GYN (CUSTOM PROCEDURE TRAY) ×2 IMPLANT
SPONGE GAUZE 4X4 12PLY (GAUZE/BANDAGES/DRESSINGS) ×2 IMPLANT
STRIP CLOSURE SKIN 1/2X4 (GAUZE/BANDAGES/DRESSINGS) ×2 IMPLANT
SUT MNCRL AB 4-0 PS2 18 (SUTURE) ×2 IMPLANT
SUT PROLENE 2 0 SH DA (SUTURE) ×2 IMPLANT
SUT SILK 2 0 SH (SUTURE) IMPLANT
SUT SILK 3 0 (SUTURE)
SUT SILK 3-0 18XBRD TIE 12 (SUTURE) IMPLANT
SUT VIC AB 2-0 CT2 27 (SUTURE) IMPLANT
SUT VIC AB 2-0 SH 27 (SUTURE) ×2
SUT VIC AB 2-0 SH 27X BRD (SUTURE) ×1 IMPLANT
SUT VIC AB 3-0 SH 27 (SUTURE) ×2
SUT VIC AB 3-0 SH 27X BRD (SUTURE) IMPLANT
SUT VIC AB 3-0 SH 27XBRD (SUTURE) IMPLANT
SUT VICRYL 0 27 CT2 27 ABS (SUTURE) ×1 IMPLANT
SUT VICRYL 0 UR6 27IN ABS (SUTURE) ×1 IMPLANT
SYR CONTROL 10ML LL (SYRINGE) ×2 IMPLANT
TOWEL OR 17X26 10 PK STRL BLUE (TOWEL DISPOSABLE) ×4 IMPLANT

## 2012-07-28 NOTE — Op Note (Signed)
Hernia, Open, Procedure Note  Indications: The patient presented with a history of a right, reducible inguinal hernia.    Pre-operative Diagnosis: right reducible inguinal hernia Post-operative Diagnosis: same  Surgeon: Wynona Luna.   Assistants: Blima Rich RNFA  Anesthesia: General LMA anesthesia  ASA Class: 1  Procedure Details  The patient was seen again in the Holding Room. The risks, benefits, complications, treatment options, and expected outcomes were discussed with the patient. The possibilities of reaction to medication, pulmonary aspiration, perforation of viscus, bleeding, recurrent infection, the need for additional procedures, and development of a complication requiring transfusion or further operation were discussed with the patient and/or family. The likelihood of success in repairing the hernia and returning the patient to their previous functional status is good.  There was concurrence with the proposed plan, and informed consent was obtained. The site of surgery was properly noted/marked. The patient was taken to the Operating Room, identified as Vernon Kent, and the procedure verified as right inguinal hernia repair. A Time Out was held and the above information confirmed.  The patient was placed in the supine position and underwent induction of anesthesia. The lower abdomen and groin was prepped with Chloraprep and draped in the standard fashion, and 0.5% Marcaine with epinephrine was used to anesthetize the skin over the mid-portion of the inguinal canal. An oblique incision was made. Dissection was carried down through the subcutaneous tissue with cautery to the external oblique fascia.  We opened the external oblique fascia along the direction of its fibers to the external ring.  The spermatic cord was circumferentially dissected bluntly and retracted with a Penrose drain.  The floor of the inguinal canal was inspected and showed a very large direct hernia defect.   The floor of the canal was closed with a 0 Vicryl.  We skeletonized the spermatic cord and reduced a small cord lipoma.  We used a 3 x 6 inch piece of Ultrapro mesh, which was cut into a keyhole shape.  This was secured with 2-0 Prolene, beginning at the pubic tubercle, running this along the internal oblique fascia superiorly and the shelving edge inferiorly.  The tails of the mesh were sutured together behind the spermatic cord.  The mesh was tucked underneath the external oblique fascia laterally.  The external oblique fascia was reapproximated with 2-0 Vicryl.  3-0 Vicryl was used to close the subcutaneous tissues and 4-0 Monocryl was used to close the skin in subcuticular fashion.  Benzoin and steri-strips were used to seal the incision.  A clean dressing was applied.  The patient was then extubated and brought to the recovery room in stable condition.  All sponge, instrument, and needle counts were correct prior to closure and at the conclusion of the case.   Estimated Blood Loss: Minimal                 Complications: None; patient tolerated the procedure well.         Disposition: PACU - hemodynamically stable.         Condition: stable  Vernon Kent. Vernon Skains, MD, [facility]  07/28/2012 8:41 AM

## 2012-07-28 NOTE — H&P (Signed)
CC - right inguinal hernia   HPI Vernon Kent is a 66 y.o. male.   HPI This is a 66 yo patient in good health who presents with at least a 14-year history of a right inguinal hernia.  Recently, it has become more symptomatic and the bulge seems more prominent.  The patient occasionally hears some "gurgling" in the hernia.  He is quite active and exercises regularly, but the hernia is causing more discomfort.  It remains reducible.  He presents now to discuss surgical repair. Past Medical History   Diagnosis  Date   .  Phlebitis of leg, left  2012   .  Asymptomatic varicose veins     .  Varicose veins     .  Insomnia     .  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 Dione Kent  )   .  Diverticulosis     .  Cancer  2012       BCC- Basal Cell Carcinoma Forehead   .  Peyronie disease  05/09/2009   .  Hyperlipidemia  05/25/2010         Past Surgical History   Procedure  Date   .  Eye surgery     .  Hernia repair  05/25/2010       Right inguinal    .  Toe surgery  July 2007       Left 2nd toe   .  Tonsillectomy  1955   .  Vasectomy     .  Tooth extraction         Wisdom teeth         Family History   Problem  Relation  Age of Onset   .  Pneumonia  Mother     .  Skin cancer  Mother     .  Heart disease  Father     .  Hypertension  Father     .  Hepatitis  Father     .  Cancer  Brother         Prostate   .  Heart attack  Brother          Social History History   Substance Use Topics   .  Smoking status:  Former Smoker       Types:  Cigarettes       Quit date:  09/06/1965   .  Smokeless tobacco:  Former Neurosurgeon       Quit date:  08/19/1966   .  Alcohol Use:  0.0 oz/week       4-5 Glasses of wine, 1-2 Drinks containing 0.5 oz of alcohol per week         Allergies   Allergen  Reactions   .  Shellfish Allergy  Anaphylaxis   .  Iodine           Current Outpatient Prescriptions   Medication  Sig  Dispense  Refill   .  aspirin  81 MG tablet  Take 81 mg by mouth daily.         .  betamethasone dipropionate (DIPROLENE) 0.05 % cream  Apply topically daily.         .  calcium carbonate 200 MG capsule  Take 250 mg by mouth 2 (two) times daily with a meal.         .  cholecalciferol (VITAMIN D) 400 UNITS TABS  Take 400 Units by mouth.         .  clobetasol cream (TEMOVATE) 0.05 %           .  diphenhydrAMINE (BENADRYL) 25 mg capsule  Take 25 mg by mouth as needed.         Marland Kitchen  EPINEPHrine (EPI-PEN) 0.3 mg/0.3 mL DEVI  Inject 0.3 mg into the muscle once.         .  fish oil-omega-3 fatty acids 1000 MG capsule  Take 2 g by mouth daily.         Marland Kitchen  glucosamine-chondroitin 500-400 MG tablet  Take 1,500 tablets by mouth daily.         Marland Kitchen  ibuprofen (ADVIL,MOTRIN) 200 MG tablet  Take 200 mg by mouth every 6 (six) hours as needed.         .  Multiple Vitamin (MULTIVITAMIN) tablet  Take 1 tablet by mouth daily.         Marland Kitchen  olopatadine (PATANOL) 0.1 % ophthalmic solution  1 drop 2 (two) times daily.           .  simvastatin (ZOCOR) 20 MG tablet           .  zolpidem (AMBIEN) 10 MG tablet  Take 10 mg by mouth at bedtime as needed.              Review of Systems Review of Systems  Constitutional: Negative for fever, chills and unexpected weight change.  HENT: Negative for hearing loss, congestion, sore throat, trouble swallowing and voice change.   Eyes: Negative for visual disturbance.  Respiratory: Negative for cough and wheezing.   Cardiovascular: Negative for chest pain, palpitations and leg swelling.  Gastrointestinal: Positive for abdominal pain. Negative for nausea, vomiting, diarrhea, constipation, blood in stool, abdominal distention, anal bleeding and rectal pain.  Genitourinary: Negative for hematuria and difficulty urinating.  Musculoskeletal: Negative for arthralgias.  Skin: Negative for rash and wound.  Neurological: Negative for seizures, syncope, weakness and headaches.  Hematological: Negative for adenopathy. Does  not bruise/bleed easily.  Psychiatric/Behavioral: Negative for confusion.      Blood pressure 118/72, pulse 74, temperature 97.8 F (36.6 C), temperature source Temporal, resp. rate 16, height 6' (1.829 m), weight 186 lb 2 oz (84.426 kg).   Physical Exam Physical Exam WDWN in NAD HEENT:  EOMI, sclera anicteric Neck:  No masses, no thyromegaly Lungs:  CTA bilaterally; normal respiratory effort CV:  Regular rate and rhythm; no murmurs Abd:  +bowel sounds, soft, non-tender, very tiny asymptomatic umbilical hernia GU:  Bilateral descended testes; visible right inguinal bulge - reducible; no sign of left inguinal hernia Ext:  Well-perfused; no edema Skin:  Warm, dry; no sign of jaundice   Data Reviewed none   Assessment    Right inguinal hernia - large reducible     Plan    Right inguinal hernia repair with mesh.  The surgical procedure has been discussed with the patient.  Potential risks, benefits, alternative treatments, and expected outcomes have been explained.  All of the patient's questions at this time have been answered.  The likelihood of reaching the patient's treatment goal is good.  The patient understand the proposed surgical procedure and wishes to proceed.  Vernon Arms. Corliss Skains, MD, [facility]  07/28/2012 7:11 AM

## 2012-07-28 NOTE — Transfer of Care (Signed)
Immediate Anesthesia Transfer of Care Note  Patient: Vernon Kent  Procedure(s) Performed: Procedure(s) (LRB) with comments: HERNIA REPAIR INGUINAL ADULT (Right) INSERTION OF MESH (Right)  Patient Location: PACU  Anesthesia Type:General  Level of Consciousness: sedated  Airway & Oxygen Therapy: Patient Spontanous Breathing and Patient connected to face mask oxygen  Post-op Assessment: Report given to PACU RN and Post -op Vital signs reviewed and stable  Post vital signs: Reviewed and stable  Complications: No apparent anesthesia complications

## 2012-07-28 NOTE — Anesthesia Postprocedure Evaluation (Signed)
  Anesthesia Post-op Note  Patient: Vernon Kent  Procedure(s) Performed: Procedure(s) (LRB): HERNIA REPAIR INGUINAL ADULT (Right) INSERTION OF MESH (Right)  Patient Location: PACU  Anesthesia Type: General  Level of Consciousness: awake and alert   Airway and Oxygen Therapy: Patient Spontanous Breathing  Post-op Pain: mild  Post-op Assessment: Post-op Vital signs reviewed, Patient's Cardiovascular Status Stable, Respiratory Function Stable, Patent Airway and No signs of Nausea or vomiting  Last Vitals:  Filed Vitals:   07/28/12 0915  BP: 114/61  Pulse: 57  Temp: 36.4 C  Resp: 12    Post-op Vital Signs: stable   Complications: No apparent anesthesia complications

## 2012-07-31 ENCOUNTER — Encounter (HOSPITAL_COMMUNITY): Payer: Self-pay | Admitting: Surgery

## 2012-08-01 ENCOUNTER — Other Ambulatory Visit: Payer: Self-pay | Admitting: *Deleted

## 2012-08-01 DIAGNOSIS — I83893 Varicose veins of bilateral lower extremities with other complications: Secondary | ICD-10-CM

## 2012-08-11 ENCOUNTER — Telehealth (INDEPENDENT_AMBULATORY_CARE_PROVIDER_SITE_OTHER): Payer: Self-pay

## 2012-08-11 NOTE — Telephone Encounter (Signed)
Pt called to verify restrictions prior to 4 wk po appt. Pt advised no heavy lifting, pushing or pulling until he comes back for his f/u appt.

## 2012-08-18 ENCOUNTER — Encounter: Payer: Self-pay | Admitting: Vascular Surgery

## 2012-08-18 ENCOUNTER — Ambulatory Visit (INDEPENDENT_AMBULATORY_CARE_PROVIDER_SITE_OTHER): Payer: BC Managed Care – PPO | Admitting: Vascular Surgery

## 2012-08-18 VITALS — BP 138/80 | HR 68 | Resp 18 | Ht 72.0 in | Wt 191.0 lb

## 2012-08-18 DIAGNOSIS — I83893 Varicose veins of bilateral lower extremities with other complications: Secondary | ICD-10-CM

## 2012-08-18 HISTORY — PX: ENDOVENOUS ABLATION SAPHENOUS VEIN W/ LASER: SUR449

## 2012-08-18 NOTE — Progress Notes (Signed)
Laser Ablation Procedure      Date: 08/18/2012    Vernon Kent DOB:[birth year]  Consent signed: Yes  Surgeon:T.F. Early  Procedure: Laser Ablation: left Small Saphenous Vein  BP 138/80  Pulse 68  Resp 18  Ht 6' (1.829 m)  Wt 191 lb (86.637 kg)  BMI 25.90 kg/m2  Start time: 10:30AM   End time: 11:50AM  Tumescent Anesthesia: 425 cc 0.9% NaCl with 50 cc Lidocaine HCL with 1% Epi and 15 cc 8.4% NaHCO3  Local Anesthesia: 2 cc Lidocaine HCL and NaHCO3 (ratio 2:1)  Continuous Mode: 15 Watts Total Energy 1403 Joules Total Time1:33     Stab Phlebectomy: 10-20 Sites: Calf  Left leg  Patient tolerated procedure well: Yes  Notes: Hibiclens was used to prep this patient due to allergy concerns.  Description of Procedure:  After marking the course of the saphenous vein and the secondary varicosities in the standing position, the patient was placed on the operating table in the prone position, and the left leg was prepped and draped in sterile fashion. Local anesthetic was administered, and under ultrasound guidance the saphenous vein was accessed with a micro needle and guide wire; then the micro puncture sheath was placed. A guide wire was inserted to the saphenopopliteal junction, followed by a 5 french sheath.  The position of the sheath and then the laser fiber below the junction was confirmed using the ultrasound and visualization of the aiming beam.  Tumescent anesthesia was administered along the course of the saphenous vein using ultrasound guidance. Protective laser glasses were placed on the patient, and the laser was fired at at 15 watt continuous mode.  For a total of 1403 joules.  A steri strip was applied to the puncture site.  The patient was then put into Trendelenburg position.  Local anesthetic was utilized overlying the marked varicosities.  Greater than 10-20 stab wounds were made using the tip of an 11 blade; and using the vein hook,  The phlebectomies were performed  using a hemostat to avulse these varicosities.  Adequate hemostasis was achieved, and steri strips were applied to the stab wound.      ABD pads and thigh high compression stockings were applied.  Ace wrap bandages were applied over the phlebectomy sites and at the top of the saphenopopliteal junction.  Blood loss was less than 15 cc.  The patient ambulated out of the operating room having tolerated the procedure well.

## 2012-08-21 ENCOUNTER — Encounter: Payer: Self-pay | Admitting: Vascular Surgery

## 2012-08-22 ENCOUNTER — Ambulatory Visit (INDEPENDENT_AMBULATORY_CARE_PROVIDER_SITE_OTHER): Payer: BC Managed Care – PPO | Admitting: Vascular Surgery

## 2012-08-22 ENCOUNTER — Encounter: Payer: Self-pay | Admitting: Vascular Surgery

## 2012-08-22 VITALS — BP 103/75 | HR 73 | Resp 16 | Ht 72.0 in | Wt 190.6 lb

## 2012-08-22 DIAGNOSIS — I83893 Varicose veins of bilateral lower extremities with other complications: Secondary | ICD-10-CM

## 2012-08-22 DIAGNOSIS — M7989 Other specified soft tissue disorders: Secondary | ICD-10-CM

## 2012-08-22 DIAGNOSIS — Z48812 Encounter for surgical aftercare following surgery on the circulatory system: Secondary | ICD-10-CM

## 2012-08-22 DIAGNOSIS — M79609 Pain in unspecified limb: Secondary | ICD-10-CM

## 2012-08-22 NOTE — Progress Notes (Signed)
Left lower extremity venous duplex post ablation performed @ [facility] 08/22/2012

## 2012-08-22 NOTE — Progress Notes (Signed)
Patient presents today for followup of his left small saphenous vein ablation and stab phlebectomy of multiple tributary varicosities on 08/18/2012. He had the usual amount of soreness associated with the procedure and mild bruising. He has had a significant amount of irritation of the skin at the popliteal space with his compression garment was some blistering over this area.  Duplex shows a no evidence of DVT with no evidence of clot the popliteal vein. There is closure of the small saphenous vein. 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  Impression and plan the good result from the ablation and stab phlebectomy. I explained that this am not concerned regarding the small area of clot in the gastrocnemius vein since it is not in the popliteal vein and is not associated with the eye. The vein ablation. He will continue his usual activities. I've asked him to discontinue the stockings since it is causing significant irritation and the popliteal space over his skin. We will see him again in 2 months for final followup and final duplex

## 2012-08-23 NOTE — Addendum Note (Signed)
Addended by: Sharee Pimple on: 08/23/2012 09:03 AM   Modules accepted: Orders

## 2012-08-24 ENCOUNTER — Encounter (INDEPENDENT_AMBULATORY_CARE_PROVIDER_SITE_OTHER): Payer: Self-pay | Admitting: Surgery

## 2012-08-24 ENCOUNTER — Ambulatory Visit (INDEPENDENT_AMBULATORY_CARE_PROVIDER_SITE_OTHER): Payer: BC Managed Care – PPO | Admitting: Surgery

## 2012-08-24 VITALS — BP 116/72 | HR 72 | Temp 97.0°F | Resp 16 | Ht 72.0 in | Wt 193.0 lb

## 2012-08-24 DIAGNOSIS — K4090 Unilateral inguinal hernia, without obstruction or gangrene, not specified as recurrent: Secondary | ICD-10-CM

## 2012-08-24 NOTE — Progress Notes (Signed)
Status post right inguinal hernia repair with mesh on 07/28/12 for a large direct inguinal hernia. The patient doing quite well. He has had a lower extremity vein procedure and have some soreness in his legs. He has no residual swelling in his right groin.  His incision is well-healed with no sign of infection. No sign of recurrent hernia.  He may resume full activity over the next couple of weeks. His activity is limited right now by his leg. Once he is released from Dr. Bosie Helper restrictions he may resume full activity. Followup when necessary  Wilmon Arms. Corliss Skains, MD, [facility]  08/24/2012 4:53 PM

## 2012-09-12 ENCOUNTER — Encounter: Payer: Self-pay | Admitting: Vascular Surgery

## 2012-10-24 ENCOUNTER — Ambulatory Visit: Payer: BC Managed Care – PPO | Admitting: Vascular Surgery

## 2012-11-27 ENCOUNTER — Encounter: Payer: Self-pay | Admitting: Vascular Surgery

## 2012-11-28 ENCOUNTER — Encounter (INDEPENDENT_AMBULATORY_CARE_PROVIDER_SITE_OTHER): Payer: BC Managed Care – PPO | Admitting: *Deleted

## 2012-11-28 ENCOUNTER — Ambulatory Visit (INDEPENDENT_AMBULATORY_CARE_PROVIDER_SITE_OTHER): Payer: BC Managed Care – PPO | Admitting: Vascular Surgery

## 2012-11-28 ENCOUNTER — Encounter: Payer: Self-pay | Admitting: Vascular Surgery

## 2012-11-28 VITALS — BP 130/77 | HR 56 | Resp 18 | Ht 72.0 in | Wt 192.9 lb

## 2012-11-28 DIAGNOSIS — Z48812 Encounter for surgical aftercare following surgery on the circulatory system: Secondary | ICD-10-CM

## 2012-11-28 DIAGNOSIS — I83893 Varicose veins of bilateral lower extremities with other complications: Secondary | ICD-10-CM

## 2012-11-28 NOTE — Progress Notes (Signed)
The patient has today for followup of his small saphenous laser ablation and stab phlebectomy on his left leg. Procedures and 08/18/2012. He had good result with his ablation. He did have one area of thrombus in the gastric Niemi is vein. He is here today for ultrasound followup of this.  She's had a very good symptom relief and reports the pain and swelling in her prior to the procedure has resolved.  Physical exam reveals good healing although phlebectomy sites. He is not any tenderness over this area  Venous duplex today reveals closure of the small saphenous vein and the left and no evidence of thrombus in the gastrocnemius vein.  Impression and plan good result following laser ablation of his left small saphenous vein and stab phlebectomy. He will see [place] again on an as-needed basis

## 2019-09-20 ENCOUNTER — Ambulatory Visit: Payer: Medicare Other | Attending: Internal Medicine

## 2019-09-20 DIAGNOSIS — Z23 Encounter for immunization: Secondary | ICD-10-CM | POA: Insufficient documentation

## 2019-09-20 NOTE — Progress Notes (Signed)
   Covid-19 Vaccination Clinic  Name:  Vernon Kent    MRN: [id number] DOB: 1946/04/25  09/20/2019  [name] was observed post Covid-19 immunization for 30 minutes based on pre-vaccination screening without incidence. He was provided with Vaccine Information Sheet and instruction to access the V-Safe system.   [name] was instructed to call 911 with any severe reactions post vaccine: Marland Kitchen Difficulty breathing  . Swelling of your face and throat  . A fast heartbeat  . A bad rash all over your body  . Dizziness and weakness    Immunizations Administered    Name Date Dose VIS Date Route   Pfizer COVID-19 Vaccine 09/20/2019 12:04 PM 0.3 mL 08/17/2019 Intramuscular   Manufacturer: [place]   Lot: [id number]   [place]: [id number]

## 2019-10-10 ENCOUNTER — Ambulatory Visit: Payer: Medicare Other | Attending: Internal Medicine

## 2019-10-10 DIAGNOSIS — Z23 Encounter for immunization: Secondary | ICD-10-CM

## 2019-10-10 NOTE — Progress Notes (Signed)
   Covid-19 Vaccination Clinic  Name:  Vernon Kent    MRN: [id number] DOB: Nov 11, 1945  10/10/2019  Vernon Kent was observed post Covid-19 immunization for 30 minutes based on pre-vaccination screening without incidence. He was provided with Vaccine Information Sheet and instruction to access the V-Safe system.   Vernon Kent was instructed to call 911 with any severe reactions post vaccine: Marland Kitchen Difficulty breathing  . Swelling of your face and throat  . A fast heartbeat  . A bad rash all over your body  . Dizziness and weakness    Immunizations Administered    Name Date Dose VIS Date Route   Pfizer COVID-19 Vaccine 10/10/2019 11:45 AM 0.3 mL 08/17/2019 Intramuscular   Manufacturer: [place]   Lot: [id number]   [place]: [id number]

## 2020-05-06 ENCOUNTER — Other Ambulatory Visit: Payer: Self-pay | Admitting: Orthopedic Surgery

## 2020-05-06 DIAGNOSIS — M25512 Pain in left shoulder: Secondary | ICD-10-CM

## 2020-05-07 ENCOUNTER — Ambulatory Visit
Admission: RE | Admit: 2020-05-07 | Discharge: 2020-05-07 | Disposition: A | Discharge status: Home / Self Care | Payer: Medicare Other | Source: Ambulatory Visit | Attending: Orthopedic Surgery | Admitting: Orthopedic Surgery

## 2020-05-07 ENCOUNTER — Other Ambulatory Visit: Payer: Self-pay

## 2020-05-07 DIAGNOSIS — M25512 Pain in left shoulder: Secondary | ICD-10-CM
